# Patient Record
Sex: Female | Born: 2000 | Race: Black or African American | Hispanic: No | Marital: Single | Smoking: Current every day smoker
Health system: Southern US, Community
[De-identification: ages and names within clinical notes are randomized; demographics above are authoritative.]

## PROBLEM LIST (undated history)

## (undated) DIAGNOSIS — L309 Dermatitis, unspecified: Secondary | ICD-10-CM

## (undated) DIAGNOSIS — J302 Other seasonal allergic rhinitis: Secondary | ICD-10-CM

---
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
## 2022-05-01 DIAGNOSIS — Z20822 Contact with and (suspected) exposure to covid-19: Secondary | ICD-10-CM | POA: Diagnosis not present

## 2022-05-03 DIAGNOSIS — Z20822 Contact with and (suspected) exposure to covid-19: Secondary | ICD-10-CM | POA: Diagnosis not present

## 2022-05-17 DIAGNOSIS — Z20822 Contact with and (suspected) exposure to covid-19: Secondary | ICD-10-CM | POA: Diagnosis not present

## 2022-05-24 DIAGNOSIS — Z20822 Contact with and (suspected) exposure to covid-19: Secondary | ICD-10-CM | POA: Diagnosis not present

## 2022-06-13 ENCOUNTER — Ambulatory Visit (HOSPITAL_COMMUNITY)
Admission: EM | Admit: 2022-06-13 | Discharge: 2022-06-13 | Disposition: A | Discharge status: Home / Self Care | Payer: 59 | Attending: Internal Medicine | Admitting: Internal Medicine

## 2022-06-13 ENCOUNTER — Encounter (HOSPITAL_COMMUNITY): Payer: Self-pay

## 2022-06-13 ENCOUNTER — Ambulatory Visit (INDEPENDENT_AMBULATORY_CARE_PROVIDER_SITE_OTHER): Payer: 59

## 2022-06-13 DIAGNOSIS — S92309A Fracture of unspecified metatarsal bone(s), unspecified foot, initial encounter for closed fracture: Secondary | ICD-10-CM

## 2022-06-13 DIAGNOSIS — S92301A Fracture of unspecified metatarsal bone(s), right foot, initial encounter for closed fracture: Secondary | ICD-10-CM | POA: Diagnosis not present

## 2022-06-13 DIAGNOSIS — M7989 Other specified soft tissue disorders: Secondary | ICD-10-CM | POA: Diagnosis not present

## 2022-06-13 MED ORDER — TRAMADOL HCL 50 MG PO TABS: 50.0000 mg | ORAL_TABLET | Freq: Two times a day (BID) | ORAL | 0 refills | Status: DC | PRN

## 2022-06-13 MED ORDER — IBUPROFEN 600 MG PO TABS: 600.0000 mg | ORAL_TABLET | Freq: Four times a day (QID) | ORAL | 0 refills | Status: DC | PRN
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
## 2022-06-13 NOTE — Discharge Instructions (Addendum)
Apply a compressive ACE bandage. Rest and elevate the affected painful area.  Apply cold compresses intermittently as needed.  As pain recedes, begin normal activities slowly as tolerated.  Call if symptoms persist.  Please call the orthopedic surgery office and schedule an appointment to be seen next week
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
## 2022-06-16 ENCOUNTER — Ambulatory Visit (HOSPITAL_COMMUNITY)
Admission: EM | Admit: 2022-06-16 | Discharge: 2022-06-16 | Disposition: A | Discharge status: Home / Self Care | Payer: 59 | Attending: Family Medicine | Admitting: Family Medicine

## 2022-06-16 DIAGNOSIS — S92301D Fracture of unspecified metatarsal bone(s), right foot, subsequent encounter for fracture with routine healing: Secondary | ICD-10-CM

## 2022-06-16 MED ORDER — HYDROCODONE-ACETAMINOPHEN 5-325 MG PO TABS: 1.0000 | ORAL_TABLET | Freq: Four times a day (QID) | ORAL | 0 refills | Status: DC | PRN

## 2022-06-16 NOTE — ED Triage Notes (Signed)
Here to get stronger medication for her L-foot injury. Pt is taking Tramadol. Pt stated medication is not helping.

## 2022-06-16 NOTE — ED Provider Notes (Signed)
[facility]    CSN: [id number] Arrival date & time: 06/16/22  1245      History   Chief Complaint Chief Complaint  Patient presents with   Foot Pain    HPI Lisa Wiggins is a 22 y.o. female.    Foot Pain   Here for continued foot pain.  She was seen here on May 2 and diagnosed with metatarsal fractures.  She comes in today stating that the tramadol is not adequately relieving her pain. On PMP the tramadol on 5/2 is the only narcotic fill I see    Past Medical History:  Diagnosis Date   Eczema    Seasonal allergies     There are no problems to display for this patient.   No past surgical history on file.  OB History   No obstetric history on file.      Home Medications    Prior to Admission medications   Medication Sig Start Date End Date Taking? Authorizing Provider  HYDROcodone-acetaminophen (NORCO/VICODIN) 5-325 MG tablet Take 1 tablet by mouth every 6 (six) hours as needed (pain). 06/16/22  Yes Zenia Resides, MD  ibuprofen (ADVIL) 600 MG tablet Take 1 tablet (600 mg total) by mouth every 6 (six) hours as needed. 06/13/22   Merrilee Jansky, MD  ondansetron (ZOFRAN-ODT) 4 MG disintegrating tablet Take 1 tablet (4 mg total) by mouth every 8 (eight) hours as needed for nausea or vomiting. 05/02/21   Mardella Layman, MD    Family History Family History  Problem Relation Age of Onset   Hypertension Mother     Social History Social History   Tobacco Use   Smoking status: Every Day   Smokeless tobacco: Never   Tobacco comments:    Black & Milds  Vaping Use   Vaping Use: Some days  Substance Use Topics   Alcohol use: Yes    Comment: occasionally   Drug use: Yes    Types: Marijuana     Allergies   Other   Review of Systems Review of Systems   Physical Exam Triage Vital Signs ED Triage Vitals [06/16/22 1402]  Enc Vitals Group     BP 131/72     Pulse Rate 82     Resp 18     Temp 97.8 F (36.6 C)     Temp Source Oral      SpO2 98 %     Weight      Height      Head Circumference      Peak Flow      Pain Score      Pain Loc      Pain Edu?      Excl. in GC?    No data found.  Updated Vital Signs BP 131/72 (BP Location: Left Arm)   Pulse 82   Temp 97.8 F (36.6 C) (Oral)   Resp 18   LMP 05/23/2022 (Approximate)   SpO2 98%   Visual Acuity Right Eye Distance:   Left Eye Distance:   Bilateral Distance:    Right Eye Near:   Left Eye Near:    Bilateral Near:     Physical Exam   UC Treatments / Results  Labs (all labs ordered are listed, but only abnormal results are displayed) Labs Reviewed - No data to display  EKG   Radiology No results found.  Procedures Procedures (including critical care time)  Medications Ordered in UC Medications - No data to display  Initial  Impression / Assessment and Plan / UC Course  I have reviewed the triage vital signs and the nursing notes.  Pertinent labs & imaging results that were available during my care of the patient were reviewed by me and considered in my medical decision making (see chart for details).        Hydrocodone is sent in for pain relief. She is going to call [facility] tomorrow when they are open, but I am also giving her other contact info for an orthopedic urgent care--to call there if Cyndia Skeeters can not get her in in a timely manner. Final Clinical Impressions(s) / UC Diagnoses   Final diagnoses:  Open nondisplaced fracture of metatarsal bone of right foot with routine healing, unspecified metatarsal, subsequent encounter     Discharge Instructions      Hydrocodone 5 mg--1 tablet every 6 hours as needed for pain.  This is best taken with food.  It can cause sleepiness or dizziness       ED Prescriptions     Medication Sig Dispense Auth. Provider   HYDROcodone-acetaminophen (NORCO/VICODIN) 5-325 MG tablet Take 1 tablet by mouth every 6 (six) hours as needed (pain). 12 tablet Kalub Morillo, Janace Aris, MD       I have reviewed the PDMP during this encounter.   Zenia Resides, MD 06/16/22 [phone]

## 2022-06-16 NOTE — Discharge Instructions (Signed)
Hydrocodone 5 mg--1 tablet every 6 hours as needed for pain.  This is best taken with food.  It can cause sleepiness or dizziness

## 2022-06-18 ENCOUNTER — Ambulatory Visit (INDEPENDENT_AMBULATORY_CARE_PROVIDER_SITE_OTHER): Payer: 59 | Admitting: Physician Assistant

## 2022-06-18 ENCOUNTER — Encounter: Payer: Self-pay | Admitting: Physician Assistant

## 2022-06-18 DIAGNOSIS — S92301A Fracture of unspecified metatarsal bone(s), right foot, initial encounter for closed fracture: Secondary | ICD-10-CM | POA: Diagnosis not present
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
## 2022-07-11 ENCOUNTER — Ambulatory Visit: Payer: 59 | Admitting: Physician Assistant

## 2022-07-11 ENCOUNTER — Telehealth: Payer: Self-pay | Admitting: Physician Assistant

## 2022-07-11 NOTE — Telephone Encounter (Signed)
Patient called requesting refill on Hydrocodone please advise stated foot is still in pain

## 2022-07-12 NOTE — Telephone Encounter (Signed)
Lisa Wiggins anne patient

## 2022-07-15 ENCOUNTER — Encounter: Payer: Self-pay | Admitting: Physician Assistant

## 2022-07-15 ENCOUNTER — Other Ambulatory Visit: Payer: Self-pay

## 2022-07-15 ENCOUNTER — Ambulatory Visit (INDEPENDENT_AMBULATORY_CARE_PROVIDER_SITE_OTHER): Payer: 59 | Admitting: Physician Assistant

## 2022-07-15 DIAGNOSIS — S92301A Fracture of unspecified metatarsal bone(s), right foot, initial encounter for closed fracture: Secondary | ICD-10-CM

## 2022-07-15 NOTE — Progress Notes (Signed)
Office Visit Note   Patient: Lisa Wiggins           Date of Birth: [birth year]           MRN: [id number] Visit Date: 07/15/2022              Requested by: No referring provider defined for this encounter. PCP: Pcp, No  Chief Complaint  Patient presents with   Right Foot - Follow-up      HPI: Patient is a 22 year old woman who is approximately 5 weeks status post right foot metatarsal neck fractures.  She has been wearing a postop shoe.  Says she still has some pain with weightbearing.  Assessment & Plan: Visit Diagnoses:  1. Multiple closed fractures of metatarsal bone of right foot, initial encounter     Plan: Radiographs demonstrate well good reduction and continued healing of the metatarsal neck fractures she can wear her postop shoe for another week or 2 then can slowly transition into regular shoes.  She should let pain be her guide.  I cautioned her against doing any high-impact activities for about another month may follow-up then if needed  Follow-Up Instructions: 1 month  Ortho Exam  Patient is alert, oriented, no adenopathy, well-dressed, normal affect, normal respiratory effort. Right foot no swelling no erythema strong pulse.  No ecchymosis.  She still has tenderness over the metatarsal neck fractures but significantly less so.  She is able to wiggle her toes.  Compartments are soft and compressible  Imaging: No results found. No images are attached to the encounter.  Labs: Lab Results  Component Value Date   REPTSTATUS 09/08/2013 FINAL 09/05/2013   GRAMSTAIN  09/05/2013    FEW WBC PRESENT, PREDOMINANTLY PMN RARE SQUAMOUS EPITHELIAL CELLS PRESENT FEW GRAM POSITIVE COCCI IN PAIRS Performed at [facility]   CULT  09/05/2013    MODERATE METHICILLIN RESISTANT STAPHYLOCOCCUS AUREUS Note: RIFAMPIN AND GENTAMICIN SHOULD NOT BE USED AS SINGLE DRUGS FOR TREATMENT OF STAPH INFECTIONS. This organism DOES NOT demonstrate inducible Clindamycin resistance in  vitro. CRITICAL RESULT CALLED TO, READ BACK BY AND VERIFIED WITH: PATTY CLARK BY  INGRAM A 7/29 2PM Performed at [facility]   LABORGA METHICILLIN RESISTANT STAPHYLOCOCCUS AUREUS 09/05/2013     No results found for: "ALBUMIN", "PREALBUMIN", "CBC"  No results found for: "MG" No results found for: "VD25OH"  No results found for: "PREALBUMIN"     No data to display           There is no height or weight on file to calculate BMI.  Orders:  Orders Placed This Encounter  Procedures   XR Foot Complete Right   No orders of the defined types were placed in this encounter.    Procedures: No procedures performed  Clinical Data: No additional findings.  ROS:  All other systems negative, except as noted in the HPI. Review of Systems  Objective: Vital Signs: There were no vitals taken for this visit.  Specialty Comments:  No specialty comments available.  PMFS History: Patient Active Problem List   Diagnosis Date Noted   Multiple closed fractures of metatarsal bone of right foot 06/18/2022   Past Medical History:  Diagnosis Date   Eczema    Seasonal allergies     Family History  Problem Relation Age of Onset   Hypertension Mother     History reviewed. No pertinent surgical history. Social History   Occupational History   Not on file  Tobacco Use   Smoking  status: Every Day   Smokeless tobacco: Never   Tobacco comments:    Black & Milds  Vaping Use   Vaping Use: Some days  Substance and Sexual Activity   Alcohol use: Yes    Comment: occasionally   Drug use: Yes    Types: Marijuana   Sexual activity: Not Currently    Partners: Female    Comment: not active since 02/15/21; homosexual

## 2023-07-11 ENCOUNTER — Emergency Department (HOSPITAL_COMMUNITY)
Admission: EM | Admit: 2023-07-11 | Discharge: 2023-07-12 | Disposition: A | Discharge status: Home / Self Care | Attending: Emergency Medicine | Admitting: Emergency Medicine

## 2023-07-11 ENCOUNTER — Other Ambulatory Visit: Payer: Self-pay

## 2023-07-11 ENCOUNTER — Encounter (HOSPITAL_COMMUNITY): Payer: Self-pay

## 2023-07-11 DIAGNOSIS — M79671 Pain in right foot: Secondary | ICD-10-CM | POA: Diagnosis present

## 2023-07-11 DIAGNOSIS — Y92410 Unspecified street and highway as the place of occurrence of the external cause: Secondary | ICD-10-CM | POA: Diagnosis not present
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
## 2023-07-12 ENCOUNTER — Emergency Department (HOSPITAL_COMMUNITY)

## 2023-07-12 NOTE — Discharge Instructions (Signed)
 It was a pleasure taking part in your care.  As discussed, the x-ray is negative for any fracture in your foot.  Please take ibuprofen  and Tylenol  for pain.  Follow-up with your PCP.
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
## 2023-07-18 ENCOUNTER — Ambulatory Visit (HOSPITAL_COMMUNITY): Admission: EM | Admit: 2023-07-18 | Discharge: 2023-07-18 | Disposition: A | Discharge status: Home / Self Care

## 2023-07-18 ENCOUNTER — Encounter (HOSPITAL_COMMUNITY): Payer: Self-pay

## 2023-07-18 DIAGNOSIS — R040 Epistaxis: Secondary | ICD-10-CM | POA: Diagnosis not present

## 2023-07-18 DIAGNOSIS — J3089 Other allergic rhinitis: Secondary | ICD-10-CM | POA: Diagnosis not present

## 2023-07-18 MED ORDER — AZELASTINE HCL 137 MCG/SPRAY NA SOLN: 1.0000 | Freq: Two times a day (BID) | NASAL | 1 refills | Status: AC

## 2023-07-18 NOTE — Discharge Instructions (Addendum)
  1. Epistaxis (Primary) 2. Environmental and seasonal allergies - Azelastine HCl 137 MCG/SPRAY SOLN; Place 1 spray into the nose 2 (two) times daily.  Dispense: 30 mL; Refill: 1 -Continue to monitor symptoms for any change in severity if there is any escalation of current symptoms or development of new symptoms follow-up in ER for further evaluation and management

## 2023-07-18 NOTE — ED Triage Notes (Signed)
 Patient reports that she has had nasal congestion that started today and then she had 3 episodes of epistaxis while at work. Patient states the episodes lasted approx 30 minutes each. No bleeding at this time. Patient also reports that she had a headache yesterday and a sore throat today.  Patient denies taking any medication for her symptoms.

## 2023-07-18 NOTE — ED Provider Notes (Signed)
 [facility] [facility]  Note:  This document was prepared using Dragon voice recognition software and may include unintentional dictation errors.  MRN: [id number] DOB: 04/16/2000  Subjective:   Lisa Wiggins is a 23 y.o. female presenting for nasal congestion, epistaxis, cough, headache x 1 day.  Patient denies any known sick contacts.  Patient denies taking any over-the-counter medication to treat symptoms.  Patient reports past history of seasonal allergies but is not taking any over-the-counter antihistamine as previously prescribed Flonase nasal spray.  No current facility-administered medications for this encounter.  Current Outpatient Medications:    Azelastine HCl 137 MCG/SPRAY SOLN, Place 1 spray into the nose 2 (two) times daily., Disp: 30 mL, Rfl: 1   Allergies  Allergen Reactions   Other     Peanuts    Past Medical History:  Diagnosis Date   Eczema    Seasonal allergies      History reviewed. No pertinent surgical history.  Family History  Problem Relation Age of Onset   Hypertension Mother     Social History   Tobacco Use   Smoking status: Every Day    Types: Cigarettes, Cigars   Smokeless tobacco: Never   Tobacco comments:    Black & Milds  Vaping Use   Vaping status: Former   Substances: Nicotine  Substance Use Topics   Alcohol use: Yes    Comment: occasionally   Drug use: Yes    Types: Marijuana    ROS Refer to HPI for ROS details.  Objective:   Vitals: BP 113/74 (BP Location: Left Arm)   Pulse 73   Temp 98.1 F (36.7 C) (Oral)   Resp 14   LMP 07/10/2023 (Approximate)   SpO2 97%   Physical Exam Vitals and nursing note reviewed.  Constitutional:      General: She is not in acute distress.    Appearance: Normal appearance. She is well-developed. She is not ill-appearing or toxic-appearing.  HENT:     Head: Normocephalic and atraumatic.     Nose: Congestion and rhinorrhea present.     Mouth/Throat:     Mouth: Mucous membranes  are moist.     Pharynx: Oropharynx is clear. No oropharyngeal exudate or posterior oropharyngeal erythema.  Eyes:     General:        Right eye: No discharge.        Left eye: No discharge.     Extraocular Movements: Extraocular movements intact.     Conjunctiva/sclera: Conjunctivae normal.  Cardiovascular:     Rate and Rhythm: Normal rate and regular rhythm.     Heart sounds: Normal heart sounds. No murmur heard. Pulmonary:     Effort: Pulmonary effort is normal. No respiratory distress.     Breath sounds: Normal breath sounds. No stridor. No wheezing, rhonchi or rales.  Musculoskeletal:        General: Normal range of motion.  Skin:    General: Skin is warm and dry.  Neurological:     General: No focal deficit present.     Mental Status: She is alert and oriented to person, place, and time.  Psychiatric:        Mood and Affect: Mood normal.        Behavior: Behavior normal.     Procedures  No results found for this or any previous visit (from the past 24 hours).  No results found.   Assessment and Plan :     Discharge Instructions       1.  Epistaxis (Primary) 2. Environmental and seasonal allergies - Azelastine HCl 137 MCG/SPRAY SOLN; Place 1 spray into the nose 2 (two) times daily.  Dispense: 30 mL; Refill: 1 -Continue to monitor symptoms for any change in severity if there is any escalation of current symptoms or development of new symptoms follow-up in ER for further evaluation and management    Floreen Hunger Elwyn Hamper, Tanyla Stege B, NP 07/18/23 1938
